# Patient Record
Sex: Male | Born: 1965 | Hispanic: Yes | Smoking: Never smoker
Health system: Southern US, Community
[De-identification: ages and names within clinical notes are randomized; demographics above are authoritative.]

## PROBLEM LIST (undated history)

## (undated) DIAGNOSIS — E119 Type 2 diabetes mellitus without complications: Secondary | ICD-10-CM

---

## 2013-10-11 ENCOUNTER — Emergency Department (HOSPITAL_COMMUNITY)
Admission: EM | Admit: 2013-10-11 | Discharge: 2013-10-11 | Disposition: A | Discharge status: Home / Self Care | Payer: Self-pay | Attending: Emergency Medicine | Admitting: Emergency Medicine

## 2013-10-11 ENCOUNTER — Encounter (HOSPITAL_COMMUNITY): Payer: Self-pay | Admitting: Emergency Medicine

## 2013-10-11 ENCOUNTER — Emergency Department (HOSPITAL_COMMUNITY): Payer: Self-pay

## 2013-10-11 DIAGNOSIS — E1165 Type 2 diabetes mellitus with hyperglycemia: Secondary | ICD-10-CM | POA: Insufficient documentation

## 2013-10-11 DIAGNOSIS — Z79899 Other long term (current) drug therapy: Secondary | ICD-10-CM | POA: Insufficient documentation

## 2013-10-11 DIAGNOSIS — R61 Generalized hyperhidrosis: Secondary | ICD-10-CM | POA: Insufficient documentation

## 2013-10-11 DIAGNOSIS — R739 Hyperglycemia, unspecified: Secondary | ICD-10-CM

## 2013-10-11 HISTORY — DX: Type 2 diabetes mellitus without complications: E11.9

## 2013-10-11 LAB — TROPONIN I

## 2013-10-11 LAB — COMPREHENSIVE METABOLIC PANEL
ALK PHOS: 91 U/L (ref 39–117)
ALT: 88 U/L — ABNORMAL HIGH (ref 0–53)
AST: 55 U/L — ABNORMAL HIGH (ref 0–37)
Albumin: 4.1 g/dL (ref 3.5–5.2)
Anion gap: 13 (ref 5–15)
BUN: 9 mg/dL (ref 6–23)
CALCIUM: 9 mg/dL (ref 8.4–10.5)
CO2: 25 mEq/L (ref 19–32)
Chloride: 96 mEq/L (ref 96–112)
Creatinine, Ser: 0.59 mg/dL (ref 0.50–1.35)
GFR calc Af Amer: >90 mL/min (ref >90)
GLUCOSE: 243 mg/dL — AB (ref 70–99)
Potassium: 4.3 mEq/L (ref 3.7–5.3)
SODIUM: 134 meq/L — AB (ref 137–147)
Total Bilirubin: 0.4 mg/dL (ref 0.3–1.2)
Total Protein: 8.1 g/dL (ref 6.0–8.3)

## 2013-10-11 LAB — CBC WITH DIFFERENTIAL/PLATELET
Basophils Absolute: 0.1 10*3/uL (ref 0.0–0.1)
Basophils Relative: 1 % (ref 0–1)
EOS ABS: 0.1 10*3/uL (ref 0.0–0.7)
EOS PCT: 1 % (ref 0–5)
HEMATOCRIT: 41.7 % (ref 39.0–52.0)
HEMOGLOBIN: 14.8 g/dL (ref 13.0–17.0)
LYMPHS ABS: 1.9 10*3/uL (ref 0.7–4.0)
Lymphocytes Relative: 28 % (ref 12–46)
MCH: 32.3 pg (ref 26.0–34.0)
MCHC: 35.5 g/dL (ref 30.0–36.0)
MCV: 91 fL (ref 78.0–100.0)
MONOS PCT: 10 % (ref 3–12)
Monocytes Absolute: 0.7 10*3/uL (ref 0.1–1.0)
Neutro Abs: 4.2 10*3/uL (ref 1.7–7.7)
Neutrophils Relative %: 60 % (ref 43–77)
Platelets: 211 10*3/uL (ref 150–400)
RBC: 4.58 MIL/uL (ref 4.22–5.81)
RDW: 12.7 % (ref 11.5–15.5)
WBC: 6.9 10*3/uL (ref 4.0–10.5)

## 2013-10-11 LAB — URINALYSIS, ROUTINE W REFLEX MICROSCOPIC
BILIRUBIN URINE: NEGATIVE
Hgb urine dipstick: NEGATIVE
KETONES UR: NEGATIVE mg/dL
Leukocytes, UA: NEGATIVE
Nitrite: NEGATIVE
PH: 6.5 (ref 5.0–8.0)
PROTEIN: NEGATIVE mg/dL
Specific Gravity, Urine: 1.015 (ref 1.005–1.030)
Urobilinogen, UA: 0.2 mg/dL (ref 0.0–1.0)

## 2013-10-11 LAB — CBG MONITORING, ED
Glucose-Capillary: 241 mg/dL — ABNORMAL HIGH (ref 70–99)
Glucose-Capillary: 220 mg/dL — ABNORMAL HIGH (ref 70–99)

## 2013-10-11 LAB — LIPASE, BLOOD: Lipase: 35 U/L (ref 11–59)

## 2013-10-11 LAB — URINE MICROSCOPIC-ADD ON

## 2013-10-11 MED ORDER — SODIUM CHLORIDE 0.9 % IV BOLUS (SEPSIS)
1000.0000 mL | Freq: Once | INTRAVENOUS | Status: AC
  Administered 2013-10-11: 1000 mL via INTRAVENOUS

## 2013-10-11 NOTE — Discharge Instructions (Signed)
Hiperglucemia °(Hyperglycemia) °La hiperglucemia ocurre cuando la glucosa (azúcar) en su sangre está demasiado elevada. Puede suceder por varias razones, pero a menudo ocurre en personas que no saben que tienen diabetes o no la controlan adecuadamente.  °CAUSAS °Tanto si tiene diabetes como si no, existen otras causas para la hiperglucemia. La hiperglucemia puede producirse cuando tiene diabetes, pero también puede presentarse en otras situaciones de las que podría no ser consciente, como por ejemplo: °Diabetes °· Si tiene diabetes y tiene problemas para controlar su glucosa en sangre, la hiperglucemia podría producirse debido a las siguientes razones: °¨ No seguir el plan de alimentación. °¨ No tomar los medicamentos para la diabetes o tomarlos de forma inadecuada. °¨ Realizar menos ejercicio del que normalmente hace. °¨ Estar enfermo. °Prediabetes °· Esto no puede ignorarse. Antes de que la persona presente diabetes de tipo 2, casi siempre hay "prediabetes". Esto ocurre cuando su glucosa en sangre es mayor que lo normal, pero no lo suficiente como para diagnosticar diabetes. La investigación ha demostrado que algunos daños al cuerpo de largo plazo, en especial los del corazón y el sistema circulatorio, podrían haber ocurrido durante el periodo de prediabetes. Si controla la glucosa en sangre cuando tiene prediabetes, podrá retardar o evitar que se desarrrolle la diabetes tipo 2. °El estrés °· Si tiene diabetes, deberá hacer una dieta, tomar medicamentos orales o insulina para controlarla. Sin embargo, encontrará que la glucosa en sangre es mayor que lo normal en el hospital tenga o no diabetes. Científicamente se lo denomina "hiperglucemia por estrés". El estrés puede elevar su glucosa en sangre. Esto ocurre porque el organismo genera hormonas en los momentos de estrés. Si el estrés ha sido la causa del alto nivel de glucosa en sangre, el médico podrá realizar un seguimiento de manera regular. De esta manera,  podrá asegurarse de que la hiperglucemia no empeora o progresa hacia diabetes. °Esteroides °· Los esteroides son medicamentos que actúan en la infección que ataca al sistema inmunológico para bloquear la inflamación o la infección. Un efecto secundario puede ser el aumento de glucosa en sangre. Muchas personas pueden producir la suficiente insulina extra para este aumento, pero aquellos que no pueden, los esteroides pueden hacer que los niveles sean aún mayores. No es inusual que los tratamientos con esteorides "destapen" una diabetes que se está desarrollando. No siempre es posible determinar si la hiperglucemia desaparecerá una vez que se detenga el consumo de esteroides. A veces se realiza un análisis de sangre especial denominado A1c para determinar si la glucosa en sangre se ha elevado antes de comenzar con el consumo de esteroides. °SÍNTOMAS °· Sed. °· Necesidad frecuente de orinar. °· Boca seca. °· Visión borrosa. °· Cansancio o fatiga. °· Debilidad. °· Somnolencia. °· Hormigueo en el pie o pierna. °DIAGNÓSTICO °El diagnóstico se realiza mediante el control de la glucosa en sangre de una o varias de las siguientes maneras: °· Análisis A1c. Es una sustancia química que se encuentra en la sangre. °· Control de glucosa en sangre con tiras de prueba. °· Resultados de laboratorio. °TRATAMIENTO °Primero, es importante conocer la causa de la hiperglucemia antes de tratarla. El tratamiento puede ser el siguiente, aunque pueden ser otros: °· Educación °· Cambios o ajustes en los medicamentos. °· Cambios o ajustes en el plan de alimentación. °· Tratamiento por enfermedades, infecciones, etc. °· Control de glucosa en sangre más frecuente. °· Cambios en el plan de ejercicios. °· Disminución o interrupción del consumo de esteroides. °· Cambios en el estilo de vida. °INSTRUCCIONES PARA   EL CUIDADO DOMICILIARIO °· Contrólese la glucosa en sangre, como se lo indicaron. °· Haga ejercicios regularmente. El profesional que lo  asiste le dará instrucciones relacionadas con el ejercicio físico. La prediabetes que es consecuencia de situaciones de estrés, puede mejorar con la actividad física. °· Consuma alimentos saludables y balanceados. Coma a menudo y de manera regular, en momentos fijos. El profesional o el nutricionista le dará una dieta especial para controlar su ingestión de azúcar. °· Mantener su peso ideal es importante. Si lo necesita, perder un poco de peso, como 5 ó 7 Kg. puede ser beneficioso para mejorar los niveles de azúcar en sangre. °SOLICITE ATENCIÓN MÉDICA SI: °· Tiene preguntas relacionadas con los medicamentos, la actividad o la dieta. °· Continúa teniendo síntomas (como mucha sed, deseos intensos de orinar o aumento de peso) °SOLICITE ATENCIÓN MÉDICA DE INMEDIATO SI: °· Vomita o tiene diarrea. °· Su respiración huele frutal. °· La frecuencia respiratoria es más rápida o más lenta. °· Está somnoliento o incoherente. °· Siente adormecimiento, hormigueos o dolor en los pies o en las manos. °· Siente dolor en el pecho. °· Sus síntomas empeoran aunque haya seguido las indicaciones de su médico. °· Tiene otras preguntas o preocupaciones. °Document Released: 12/23/2004 Document Revised: 03/17/2011 °[employer]® Patient Information ©2015 [employer]. This information is not intended to replace advice given to you by your health care provider. Make sure you discuss any questions you have with your health care provider. ° °

## 2013-10-11 NOTE — ED Notes (Signed)
Blurred vision; states,"hard to see when driving; hard to control the car."

## 2013-10-11 NOTE — ED Notes (Signed)
MD at bedside. 

## 2013-10-11 NOTE — ED Provider Notes (Signed)
CSN: [id number]     Arrival date & time 10/11/13  0719 History   First MD Initiated Contact with Patient 10/11/13 [phone]     Chief Complaint  Patient presents with  . Blurred Vision     (Consider location/radiation/quality/duration/timing/severity/associated sxs/prior Treatment) HPI Comments: Patient presents to the ER for evaluation of blurred vision. Patient reports that when he woke this morning he noticed that he was having blurry vision. He noticed that he was having trouble seeing when he was driving and had to stop at the hospital to be evaluated. Patient reports that he did drink beer this weekend has not taken any of his medications over the weekend, but did take his medications this morning.  Recent is not experiencing any headache. There is no chest pain or shortness of breath. He does report that he could not sleep well last night and felt like he was sweating through the night. No cough or documented fever.   Past Medical History  Diagnosis Date  . Diabetes mellitus without complication    History reviewed. No pertinent past surgical history. History reviewed. No pertinent family history. History  Substance Use Topics  . Smoking status: Never Smoker   . Smokeless tobacco: Not on file  . Alcohol Use: Yes    Review of Systems  Constitutional: Positive for diaphoresis.  Eyes: Positive for visual disturbance.  All other systems reviewed and are negative.     Allergies  Review of patient's allergies indicates no known allergies.  Home Medications   Prior to Admission medications   Medication Sig Start Date End Date Taking? Authorizing Provider  glimepiride (AMARYL) 2 MG tablet Take 2 mg by mouth daily with breakfast.   Yes Historical Provider, MD  metFORMIN (GLUCOPHAGE) 1000 MG tablet Take 1,000 mg by mouth 2 (two) times daily with a meal.   Yes Historical Provider, MD  PRESCRIPTION MEDICATION Take 1 tablet by mouth every evening. Cholesterol med   Yes Historical  Provider, MD   BP 154/83  Pulse 63  Temp(Src) 97.7 F (36.5 C) (Oral)  Resp 14  Ht 5\' 3"  (1.6 m)  Wt 154 lb (69.854 kg)  BMI 27.29 kg/m2  SpO2 98% Physical Exam  Constitutional: He is oriented to person, place, and time. He appears well-developed and well-nourished. No distress.  HENT:  Head: Normocephalic and atraumatic.  Right Ear: Hearing normal.  Left Ear: Hearing normal.  Nose: Nose normal.  Mouth/Throat: Oropharynx is clear and moist and mucous membranes are normal.  Eyes: Conjunctivae and EOM are normal. Pupils are equal, round, and reactive to light.  Neck: Normal range of motion. Neck supple.  Cardiovascular: Regular rhythm, S1 normal and S2 normal.  Exam reveals no gallop and no friction rub.   No murmur heard. Pulmonary/Chest: Effort normal and breath sounds normal. No respiratory distress. He exhibits no tenderness.  Abdominal: Soft. Normal appearance and bowel sounds are normal. There is no hepatosplenomegaly. There is no tenderness. There is no rebound, no guarding, no tenderness at McBurney's point and negative Murphy's sign. No hernia.  Musculoskeletal: Normal range of motion.  Neurological: He is alert and oriented to person, place, and time. He has normal strength. No cranial nerve deficit or sensory deficit. Coordination normal. GCS eye subscore is 4. GCS verbal subscore is 5. GCS motor subscore is 6.  Skin: Skin is warm, dry and intact. No rash noted. No cyanosis.  Psychiatric: He has a normal mood and affect. His speech is normal and behavior is normal. Thought content  normal.    ED Course  Procedures (including critical care time) Labs Review Labs Reviewed  COMPREHENSIVE METABOLIC PANEL - Abnormal; Notable for the following:    Sodium 134 (*)    Glucose, Bld 243 (*)    AST 55 (*)    ALT 88 (*)    All other components within normal limits  CBG MONITORING, ED - Abnormal; Notable for the following:    Glucose-Capillary 241 (*)    All other components  within normal limits  CBG MONITORING, ED - Abnormal; Notable for the following:    Glucose-Capillary 220 (*)    All other components within normal limits  CBC WITH DIFFERENTIAL  LIPASE, BLOOD  TROPONIN I  URINALYSIS, ROUTINE W REFLEX MICROSCOPIC    Imaging Review Dg Chest 2 View  10/11/2013   CLINICAL DATA:  Night sweats  EXAM: CHEST  2 VIEW  COMPARISON:  None.  FINDINGS: The heart size and mediastinal contours are within normal limits. Both lungs are clear. The visualized skeletal structures are unremarkable.  IMPRESSION: No active cardiopulmonary disease.   Electronically Signed   By: Marlan Palau M.D.   On: 10/11/2013 08:13     EKG Interpretation   Date/Time:  Tuesday October 11 2013 07:48:46 EDT Ventricular Rate:  64 PR Interval:  150 QRS Duration: 84 QT Interval:  391 QTC Calculation: 403 R Axis:   54 Text Interpretation:  Sinus rhythm Borderline T wave abnormalities No  previous tracing Confirmed by Mykalah Saari  MD, Elyzabeth Goatley ([id number]) on  10/11/2013 8:29:23 AM      MDM   Final diagnoses:  None   hyperglycemia  Patient presented to the ER for evaluation of blurred vision. Patient does have a history of diabetes. Mister drinking alcohol over the weekend and not taking his medications. He had blurred vision when he woke up, took his Amaryl and Glucophage prior to coming to the ER. I suspect it was more hyperglycemic prior to arrival and the medications are helping. Blood sugar was 241 arrival. He is improving. Patient given a liter of fluids. Repeat sugar is 220. Upon reevaluation his blurred vision has resolved and he feels back to his normal self. The remainder of his workup was unremarkable. Patient will be discharged, counseled to take his medications as prescribed. Return if symptoms worsen. Hyper to    Gilda Crease, MD 10/11/13 [phone]

## 2013-10-11 NOTE — ED Notes (Signed)
Pt returned from xray with no signs of distress.

## 2013-10-11 NOTE — ED Notes (Signed)
Urinal given

## 2013-10-11 NOTE — ED Notes (Signed)
PT ambulated with baseline gait; VSS; A&Ox3; no signs of distress; respirations even and unlabored; skin warm and dry; no questions upon discharge.  

## 2015-12-25 IMAGING — CR DG CHEST 2V
2 series · 2 of 2 positions shown · non-contrast
Comparison: None.

CLINICAL DATA: Night sweats

EXAM:
CHEST  2 VIEW

[w chest pa]
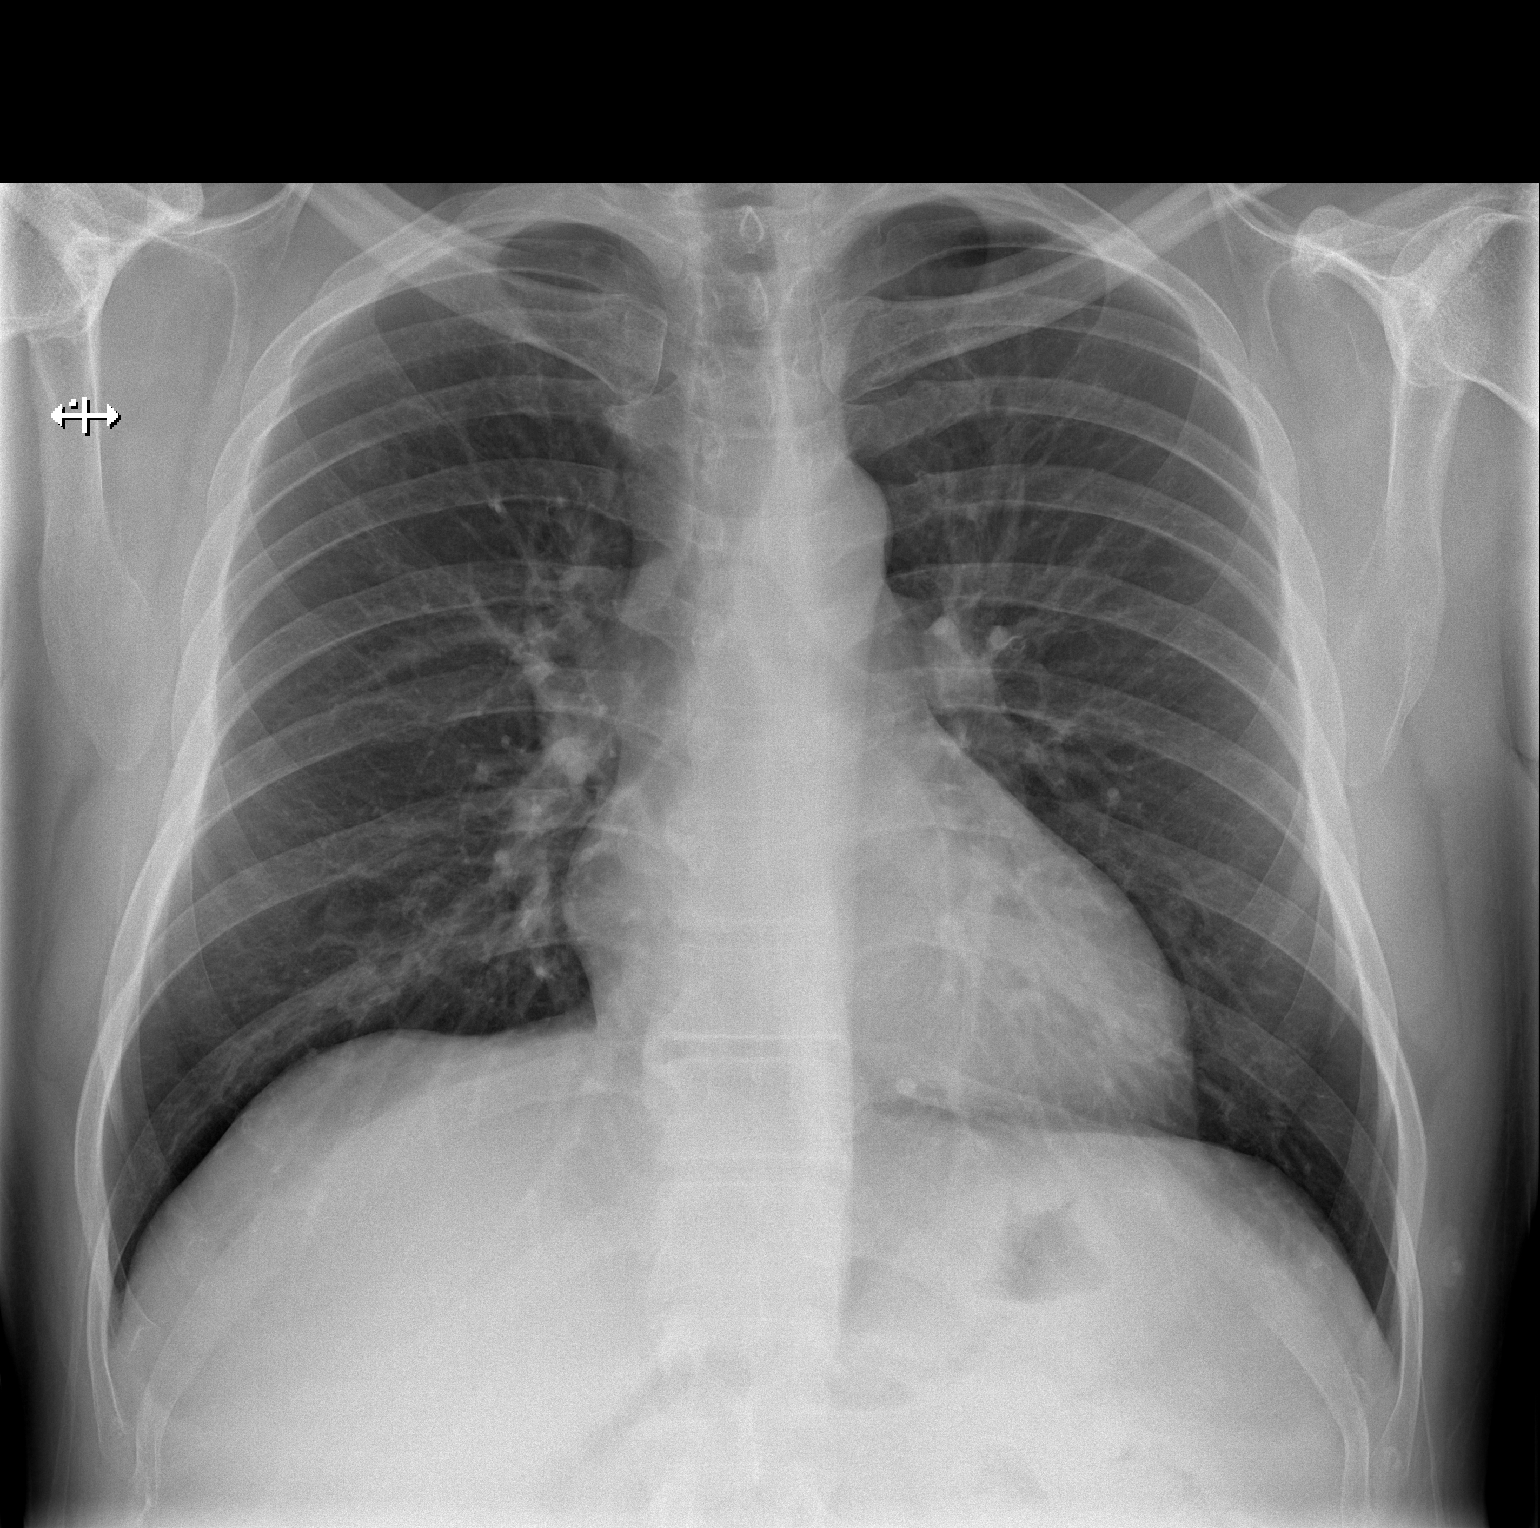

[w chest lat]
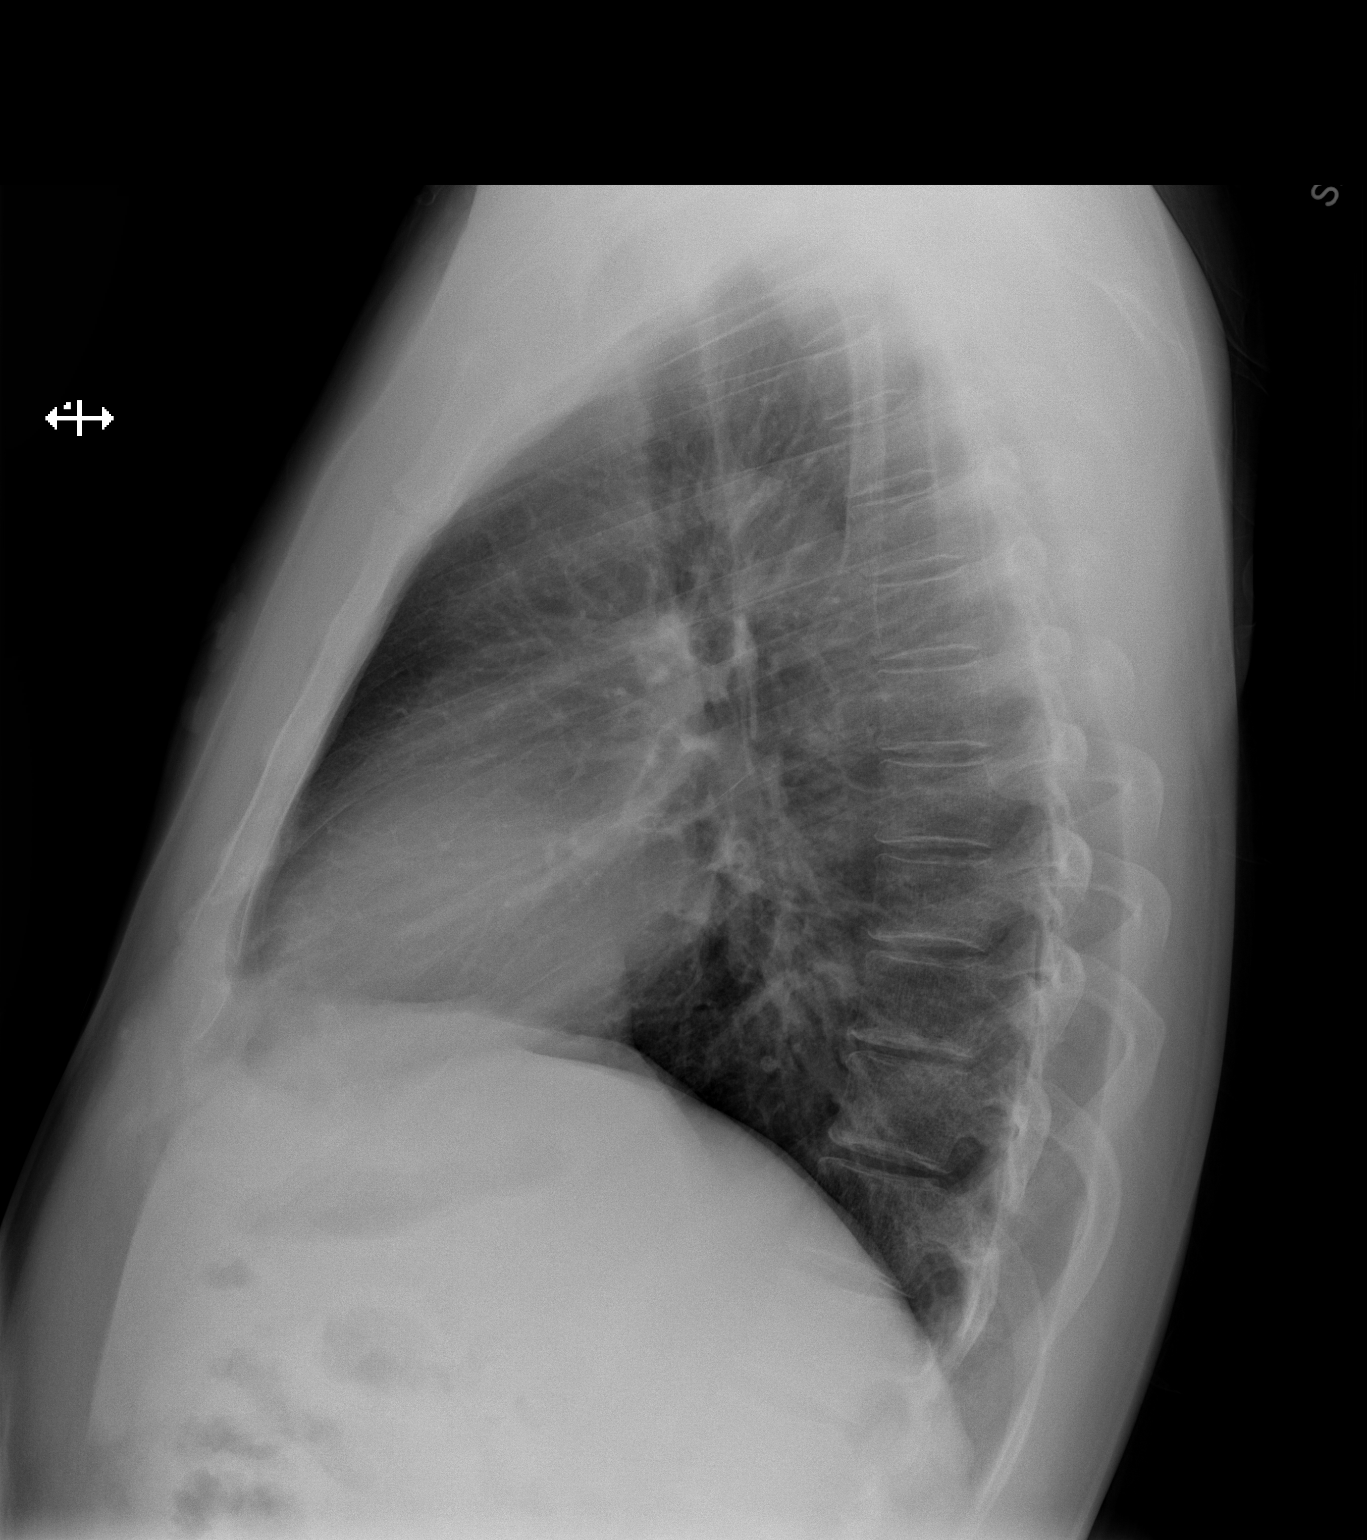

[2 of 2 positions shown; findings below may reference images not displayed]

FINDINGS: The heart size and mediastinal contours are within normal limits.
Both lungs are clear. The visualized skeletal structures are
unremarkable.
IMPRESSION: No active cardiopulmonary disease.
